# Patient Record
Sex: Female | Born: 1995 | Race: White | Hispanic: No | Marital: Single | Smoking: Never smoker
Health system: Southern US, Community
[De-identification: ages and names within clinical notes are randomized; demographics above are authoritative.]

## PROBLEM LIST (undated history)

## (undated) DIAGNOSIS — R569 Unspecified convulsions: Secondary | ICD-10-CM

---

## 2018-03-03 ENCOUNTER — Emergency Department (HOSPITAL_COMMUNITY)
Admission: EM | Admit: 2018-03-03 | Discharge: 2018-03-04 | Disposition: A | Discharge status: Home / Self Care | Attending: Emergency Medicine | Admitting: Emergency Medicine

## 2018-03-03 ENCOUNTER — Other Ambulatory Visit: Payer: Self-pay

## 2018-03-03 ENCOUNTER — Encounter (HOSPITAL_COMMUNITY): Payer: Self-pay | Admitting: *Deleted

## 2018-03-03 DIAGNOSIS — R1031 Right lower quadrant pain: Secondary | ICD-10-CM | POA: Insufficient documentation

## 2018-03-03 DIAGNOSIS — Z79899 Other long term (current) drug therapy: Secondary | ICD-10-CM | POA: Diagnosis not present

## 2018-03-03 HISTORY — DX: Unspecified convulsions: R56.9

## 2018-03-03 LAB — CBC
HCT: 41.2 % (ref 36.0–46.0)
Hemoglobin: 13.8 g/dL (ref 12.0–15.0)
MCH: 30.2 pg (ref 26.0–34.0)
MCHC: 33.5 g/dL (ref 30.0–36.0)
MCV: 90.2 fL (ref 80.0–100.0)
Platelets: 412 10*3/uL — ABNORMAL HIGH (ref 150–400)
RBC: 4.57 MIL/uL (ref 3.87–5.11)
RDW: 11.6 % (ref 11.5–15.5)
WBC: 11.6 10*3/uL — ABNORMAL HIGH (ref 4.0–10.5)
nRBC: 0 % (ref 0.0–0.2)

## 2018-03-03 LAB — URINALYSIS, ROUTINE W REFLEX MICROSCOPIC
Bacteria, UA: NONE SEEN
Bilirubin Urine: NEGATIVE
Glucose, UA: NEGATIVE mg/dL
Ketones, ur: NEGATIVE mg/dL
Leukocytes, UA: NEGATIVE
Nitrite: NEGATIVE
PROTEIN: NEGATIVE mg/dL
Specific Gravity, Urine: 1.021 (ref 1.005–1.030)
pH: 5 (ref 5.0–8.0)

## 2018-03-03 LAB — COMPREHENSIVE METABOLIC PANEL
ALT: 17 U/L (ref 0–44)
AST: 22 U/L (ref 15–41)
Albumin: 4.2 g/dL (ref 3.5–5.0)
Alkaline Phosphatase: 71 U/L (ref 38–126)
Anion gap: 12 (ref 5–15)
BUN: 11 mg/dL (ref 6–20)
CO2: 21 mmol/L — ABNORMAL LOW (ref 22–32)
Calcium: 9.4 mg/dL (ref 8.9–10.3)
Chloride: 107 mmol/L (ref 98–111)
Creatinine, Ser: 0.83 mg/dL (ref 0.44–1.00)
GFR calc Af Amer: >60 mL/min (ref >60)
GFR calc non Af Amer: >60 mL/min (ref >60)
GLUCOSE: 118 mg/dL — AB (ref 70–99)
Potassium: 3.9 mmol/L (ref 3.5–5.1)
Sodium: 140 mmol/L (ref 135–145)
Total Bilirubin: 0.4 mg/dL (ref 0.3–1.2)
Total Protein: 7.2 g/dL (ref 6.5–8.1)

## 2018-03-03 LAB — LIPASE, BLOOD: Lipase: 24 U/L (ref 11–51)

## 2018-03-03 LAB — I-STAT BETA HCG BLOOD, ED (MC, WL, AP ONLY)

## 2018-03-03 MED ORDER — SODIUM CHLORIDE 0.9% FLUSH: 3.0000 mL | Freq: Once | INTRAVENOUS | Status: DC

## 2018-03-03 NOTE — ED Triage Notes (Signed)
Right lower abdominal pain started about 1 hour ago, vomited x1, no fevers. Took ibuprofen about 1 hour ago. Offered nausea meds, refused at this time.

## 2018-03-04 NOTE — ED Provider Notes (Signed)
[facility] EMERGENCY DEPARTMENT Provider Note   CSN: [id number] Arrival date & time: 03/03/18  2048     History   Chief Complaint Chief Complaint  Patient presents with  . Abdominal Pain    HPI Sarah Erickson is a 23 y.o. female.  The history is provided by the patient.  Abdominal Pain  She has history of seizures, and comes in with right lower quadrant pain which started about 7:30 PM.  Pain came on suddenly, and was described as sharp.  She rated pain at 10/10.  Nothing made it better, nothing made it worse.  There is associated nausea and vomiting.  Pain lasted until about 1 AM when it spontaneously went away.  She is completely pain-free now.  There was some slight vaginal spotting.  Last menses was 2 weeks ago and normal.  She denies fever, chills, sweats.  She denies urinary urgency, frequency, tenesmus, dysuria.  Past Medical History:  Diagnosis Date  . Seizures (HCC)    epilepsy    There are no active problems to display for this patient.   History reviewed. No pertinent surgical history.   OB History   No obstetric history on file.      Home Medications    Prior to Admission medications   Medication Sig Start Date End Date Taking? Authorizing Provider  LamoTRIgine (LAMICTAL XR) 300 MG TB24 24 hour tablet Take 300 mg by mouth daily.   Yes [provider]  levETIRAcetam (KEPPRA XR) 500 MG 24 hr tablet Take 1,500 mg by mouth daily.   Yes [provider]    Family History No family history on file.  Social History Social History   Tobacco Use  . Smoking status: Never Smoker  Substance Use Topics  . Alcohol use: Yes  . Drug use: Never     Allergies   Patient has no known allergies.   Review of Systems Review of Systems  Gastrointestinal: Positive for abdominal pain.  All other systems reviewed and are negative.    Physical Exam Updated Vital Signs BP (!) 113/59   Pulse 77   Temp 98.1 F (36.7 C) (Oral)    Resp 18   Ht 5\' 5"  (1.651 m)   Wt 99.3 kg   LMP 02/17/2018   SpO2 97%   BMI 36.44 kg/m   Physical Exam Vitals signs and nursing note reviewed.    23 year old female, resting comfortably and in no acute distress. Vital signs are normal. Oxygen saturation is 97%, which is normal. Head is normocephalic and atraumatic. PERRLA, EOMI. Oropharynx is clear. Neck is nontender and supple without adenopathy or JVD. Back is nontender and there is no CVA tenderness. Lungs are clear without rales, wheezes, or rhonchi. Chest is nontender. Heart has regular rate and rhythm without murmur. Abdomen is soft, flat, nontender without masses or hepatosplenomegaly and peristalsis is normoactive. Extremities have no cyanosis or edema, full range of motion is present. Skin is warm and dry without rash. Neurologic: Mental status is normal, cranial nerves are intact, there are no motor or sensory deficits.  ED Treatments / Results  Labs (all labs ordered are listed, but only abnormal results are displayed) Labs Reviewed  COMPREHENSIVE METABOLIC PANEL - Abnormal; Notable for the following components:      Result Value   CO2 21 (*)    Glucose, Bld 118 (*)    All other components within normal limits  CBC - Abnormal; Notable for the following components:  WBC 11.6 (*)    Platelets 412 (*)    All other components within normal limits  URINALYSIS, ROUTINE W REFLEX MICROSCOPIC - Abnormal; Notable for the following components:   Hgb urine dipstick SMALL (*)    All other components within normal limits  LIPASE, BLOOD  I-STAT BETA HCG BLOOD, ED ([facility], [facility], [facility] ONLY)   Procedures Procedures   Medications Ordered in ED Medications  sodium chloride flush (NS) 0.9 % injection 3 mL (has no administration in time range)     Initial Impression / Assessment and Plan / ED Course  I have reviewed the triage vital signs and the nursing notes.  Pertinent lab results that were available during my care of the  patient were reviewed by me and considered in my medical decision making (see chart for details).  Right lower quadrant pain which has resolved.  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  Labs are unremarkable.  With pain having resolved and completely benign exam, no indication for imaging.  I have discussed this with the patient and with her mother.  She is given strict return precautions.  Advised to use over-the-counter analgesics as needed for mild pain.  She has no prior records in the [facility] health system,  Final Clinical Impressions(s) / ED Diagnoses   Final diagnoses:  RLQ abdominal pain    ED Discharge Orders    None       Dione Booze, MD 03/04/18 [phone]

## 2018-03-04 NOTE — Discharge Instructions (Addendum)
You may take ibuprofen or acetaminophen as needed for pain.  Return if pain is getting worse.

## 2018-03-06 ENCOUNTER — Emergency Department (HOSPITAL_COMMUNITY)

## 2018-03-06 ENCOUNTER — Emergency Department (HOSPITAL_COMMUNITY)
Admission: EM | Admit: 2018-03-06 | Discharge: 2018-03-06 | Disposition: A | Discharge status: Home / Self Care | Attending: Emergency Medicine | Admitting: Emergency Medicine

## 2018-03-06 ENCOUNTER — Encounter (HOSPITAL_COMMUNITY): Payer: Self-pay

## 2018-03-06 DIAGNOSIS — R300 Dysuria: Secondary | ICD-10-CM | POA: Diagnosis present

## 2018-03-06 DIAGNOSIS — N3091 Cystitis, unspecified with hematuria: Secondary | ICD-10-CM | POA: Insufficient documentation

## 2018-03-06 DIAGNOSIS — Z79899 Other long term (current) drug therapy: Secondary | ICD-10-CM | POA: Diagnosis not present

## 2018-03-06 DIAGNOSIS — R52 Pain, unspecified: Secondary | ICD-10-CM

## 2018-03-06 DIAGNOSIS — N3090 Cystitis, unspecified without hematuria: Secondary | ICD-10-CM

## 2018-03-06 DIAGNOSIS — R3129 Other microscopic hematuria: Secondary | ICD-10-CM

## 2018-03-06 LAB — URINALYSIS, ROUTINE W REFLEX MICROSCOPIC
Bilirubin Urine: NEGATIVE
Glucose, UA: NEGATIVE mg/dL
Ketones, ur: NEGATIVE mg/dL
Leukocytes, UA: NEGATIVE
Nitrite: NEGATIVE
Protein, ur: NEGATIVE mg/dL
RBC / HPF: >50 RBC/hpf — ABNORMAL HIGH (ref 0–5)
SPECIFIC GRAVITY, URINE: 1.02 (ref 1.005–1.030)
pH: 7 (ref 5.0–8.0)

## 2018-03-06 LAB — PREGNANCY, URINE: Preg Test, Ur: NEGATIVE

## 2018-03-06 MED ORDER — NITROFURANTOIN MONOHYD MACRO 100 MG PO CAPS: 100.0000 mg | ORAL_CAPSULE | Freq: Two times a day (BID) | ORAL | 0 refills | Status: DC

## 2018-03-06 MED ORDER — PHENAZOPYRIDINE HCL 200 MG PO TABS: 200.0000 mg | ORAL_TABLET | Freq: Three times a day (TID) | ORAL | 0 refills | Status: AC

## 2018-03-06 MED ORDER — NITROFURANTOIN MONOHYD MACRO 100 MG PO CAPS: 100.0000 mg | ORAL_CAPSULE | Freq: Two times a day (BID) | ORAL | 0 refills | Status: AC

## 2018-03-06 MED ORDER — DOCUSATE SODIUM 250 MG PO CAPS: 250.0000 mg | ORAL_CAPSULE | Freq: Every day | ORAL | 0 refills | Status: DC

## 2018-03-06 NOTE — ED Triage Notes (Signed)
Pt presents for evaluation of dysuria. Reports she was here on 1/16 for abd pain and told she likely had ovarian cyst rupture. Urinalysis at that time was normal. Reports frequency and urgency as well.

## 2018-03-06 NOTE — ED Provider Notes (Addendum)
[facility] Provider Note   CSN: [id number] Arrival date & time: 03/06/18  1210     History   Chief Complaint Chief Complaint  Patient presents with  . Dysuria    HPI Ardyth Galmma Konitzer is a 23 y.o. female.  HPI 23 year old female with history of epilepsy comes in with chief complaint of pain with urination. Patient reports that she started having pain with urination about 2 days ago.  She was seen in the ER 3 days ago with sudden onset right-sided abdominal pain.  Her pain was in the right lower quadrant and then resolved by the time she was seen in the ER.  The day after she started noticing some pain with urination.  She is also noticing urinary frequency without any hematuria.  Patient has no vaginal bleeding, vaginal discharge, her last period was 3 weeks ago.  Patient has no history of STDs and denies any risk factors for the same.  Past Medical History:  Diagnosis Date  . Seizures (HCC)    epilepsy    There are no active problems to display for this patient.   History reviewed. No pertinent surgical history.   OB History   No obstetric history on file.      Home Medications    Prior to Admission medications   Medication Sig Start Date End Date Taking? Authorizing Provider  LamoTRIgine (LAMICTAL XR) 300 MG TB24 24 hour tablet Take 300 mg by mouth daily.    [provider]  levETIRAcetam (KEPPRA XR) 500 MG 24 hr tablet Take 1,500 mg by mouth daily.    [provider]  nitrofurantoin, macrocrystal-monohydrate, (MACROBID) 100 MG capsule Take 1 capsule (100 mg total) by mouth 2 (two) times daily. 03/06/18   Derwood KaplanNanavati, Catia Todorov, MD  phenazopyridine (PYRIDIUM) 200 MG tablet Take 1 tablet (200 mg total) by mouth 3 (three) times daily. 03/06/18   Derwood KaplanNanavati, Seleny Allbright, MD    Family History No family history on file.  Social History Social History   Tobacco Use  . Smoking status: Never Smoker  Substance Use Topics  .  Alcohol use: Yes  . Drug use: Never     Allergies   Patient has no known allergies.   Review of Systems Review of Systems  Constitutional: Positive for activity change.  Gastrointestinal: Negative for nausea and vomiting.  Genitourinary: Positive for dysuria. Negative for vaginal bleeding and vaginal discharge.     Physical Exam Updated Vital Signs BP 133/88 (BP Location: Right Arm)   Pulse 87   Temp 98.1 F (36.7 C)   Resp 16   LMP 02/17/2018   SpO2 96%   Physical Exam Vitals signs and nursing note reviewed.  Constitutional:      Appearance: She is well-developed.  HENT:     Head: Normocephalic and atraumatic.  Neck:     Musculoskeletal: Normal range of motion and neck supple.  Cardiovascular:     Rate and Rhythm: Normal rate.  Pulmonary:     Effort: Pulmonary effort is normal.  Abdominal:     General: Bowel sounds are normal.     Tenderness: There is no abdominal tenderness.  Skin:    General: Skin is warm and dry.  Neurological:     Mental Status: She is alert and oriented to person, place, and time.      ED Treatments / Results  Labs (all labs ordered are listed, but only abnormal results are displayed) Labs Reviewed  URINALYSIS, ROUTINE W REFLEX MICROSCOPIC -  Abnormal; Notable for the following components:      Result Value   APPearance CLOUDY (*)    Hgb urine dipstick MODERATE (*)    RBC / HPF >50 (*)    Bacteria, UA RARE (*)    All other components within normal limits  PREGNANCY, URINE    EKG None  Radiology [place] Pelvis (transabdominal Only)  Result Date: 03/06/2018 CLINICAL DATA:  Abdomen pain. EXAM: TRANSABDOMINAL ULTRASOUND OF PELVIS TECHNIQUE: Transabdominal ultrasound examination of the pelvis was performed including evaluation of the uterus, ovaries, adnexal regions, and pelvic cul-de-sac. COMPARISON:  None. FINDINGS: Uterus Measurements: 7.3 x 2.7 x 3.9 cm No fibroids or other mass visualized. Endometrium Thickness: 10 mm.  No focal  abnormality visualized. Right ovary Measurements: 2.6 x 1.4 x 2.1 cm = volume: 4.38 mL. Normal appearance/no adnexal mass. Left ovary Measurements: 2.1 x 1.6 x 1.2 cm = volume: 2.34 mL. Normal appearance/no adnexal mass. Other findings:  Trace free fluid is identified. IMPRESSION: No acute abnormality. Electronically Signed   By: Sherian ReinWei-Chen  Lin M.D.   On: 03/06/2018 16:36   [place] Renal  Result Date: 03/06/2018 CLINICAL DATA:  Flank pain EXAM: RENAL / URINARY TRACT ULTRASOUND COMPLETE COMPARISON:  None. FINDINGS: Right Kidney: Renal measurements: 10.2 x 3.8 x 4.8 cm = volume: 99.9 mL . Echogenicity within normal limits. No mass or hydronephrosis visualized. Left Kidney: Renal measurements: 11 x 4.3 x 4.1 cm = volume: 104.4 mL. Echogenicity within normal limits. No mass or hydronephrosis visualized. Bladder: Appears normal for degree of bladder distention. IMPRESSION: Normal renal ultrasound. Electronically Signed   By: Sherian ReinWei-Chen  Lin M.D.   On: 03/06/2018 16:15    Procedures Procedures (including critical care time)  Medications Ordered in ED Medications - No data to display   Initial Impression / Assessment and Plan / ED Course  I have reviewed the triage vital signs and the nursing notes.  Pertinent labs & imaging results that were available during my care of the patient were reviewed by me and considered in my medical decision making (see chart for details).     Patient comes in with chief complaint of dysuria.  She also reports intermittent episodes of right-sided abdominal pain.  Along with her dysuria she is having urinary frequency but no gross hematuria.  She has no STD risk factors and denies any vaginal discharge or bleeding.  At the moment she has no pelvic pain.  Differential diagnosis includes UTI.  We ordered a UA which is positive for RBCs, but does not have any other significant markers of infection.  The next office to get ultrasound pelvis and ultrasound of the kidneys to see if  there is any possibility patient might have a kidney stone or fibroids/cyst.  If the ultrasound results are normal then we will send her home with Macrobid.  4:58 PM Results from the ER workup discussed with the patient face to face and all questions answered to the best of my ability. We will treat the uti with Macrobid.  Final Clinical Impressions(s) / ED Diagnoses   Final diagnoses:  Pain  Cystitis  Microscopic hematuria    ED Discharge Orders         Ordered    nitrofurantoin, macrocrystal-monohydrate, (MACROBID) 100 MG capsule  2 times daily,   Status:  Discontinued     03/06/18 1645    docusate sodium (COLACE) 250 MG capsule  Daily,   Status:  Discontinued     03/06/18 1645    phenazopyridine (PYRIDIUM)  200 MG tablet  3 times daily     03/06/18 1658    nitrofurantoin, macrocrystal-monohydrate, (MACROBID) 100 MG capsule  2 times daily     03/06/18 1658           Derwood Kaplan, MD 03/06/18 1546    Derwood Kaplan, MD 03/06/18 1658

## 2018-03-06 NOTE — ED Notes (Signed)
ED Provider at bedside. 

## 2018-03-06 NOTE — ED Notes (Signed)
Patient verbalizes understanding of discharge instructions. Opportunity for questioning and answers were provided. Armband removed by staff, pt discharged from ED. Pt ambulatory to lobby.  

## 2018-03-06 NOTE — Discharge Instructions (Signed)
The ultrasound does not reveal any concerning findings. Suspect that you might be having a cystitis, therefore we will send you home with antibiotics.  If your symptoms do not improve, then you need to consider seeing a primary care doctor or a gynecologist.

## 2020-07-20 IMAGING — US US PELVIS COMPLETE
1 series · 14 of 25 positions shown · non-contrast
Comparison: None.

CLINICAL DATA: Abdomen pain.

EXAM:
TRANSABDOMINAL ULTRASOUND OF PELVIS
TECHNIQUE: Transabdominal ultrasound examination of the pelvis was performed
including evaluation of the uterus, ovaries, adnexal regions, and
pelvic cul-de-sac.

[Series 1: us pelvis complete · 14 of 27 slices shown]
[im 1/27]
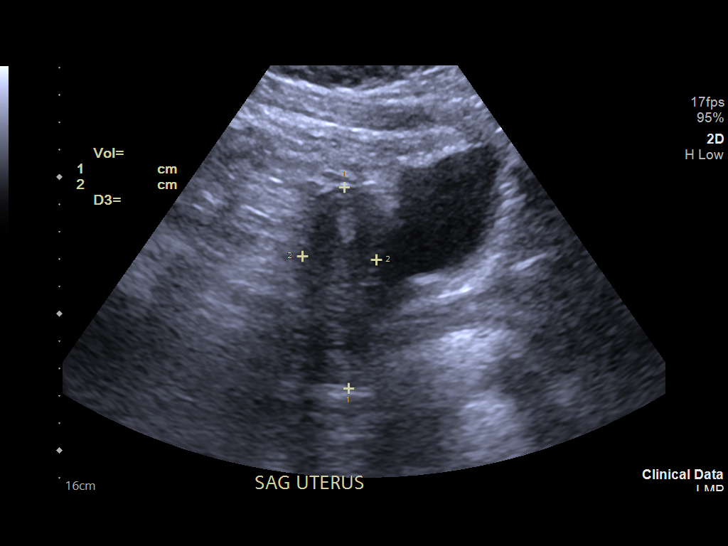
[im 3/27]
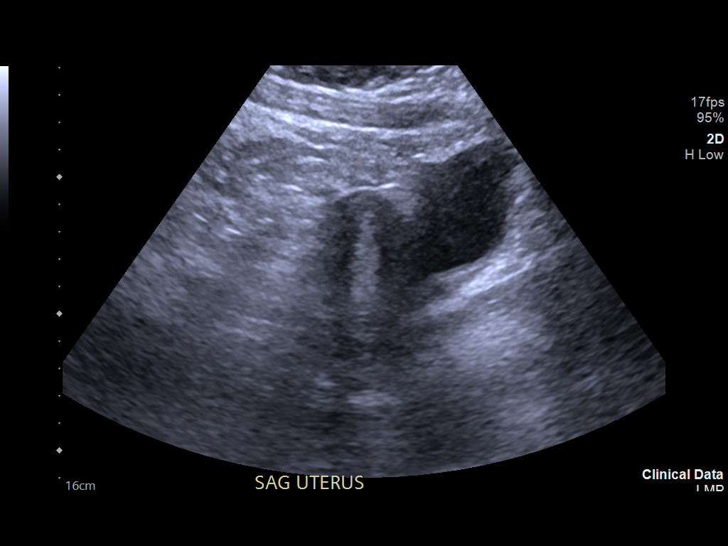
[im 5/27]
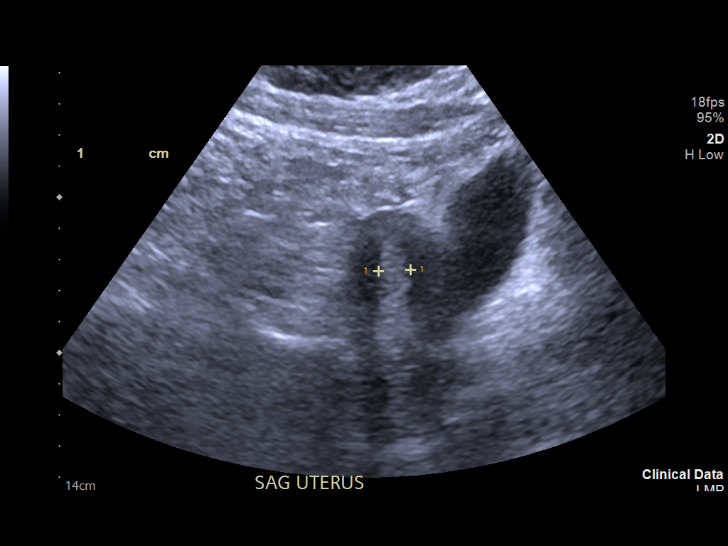
[im 7/27]
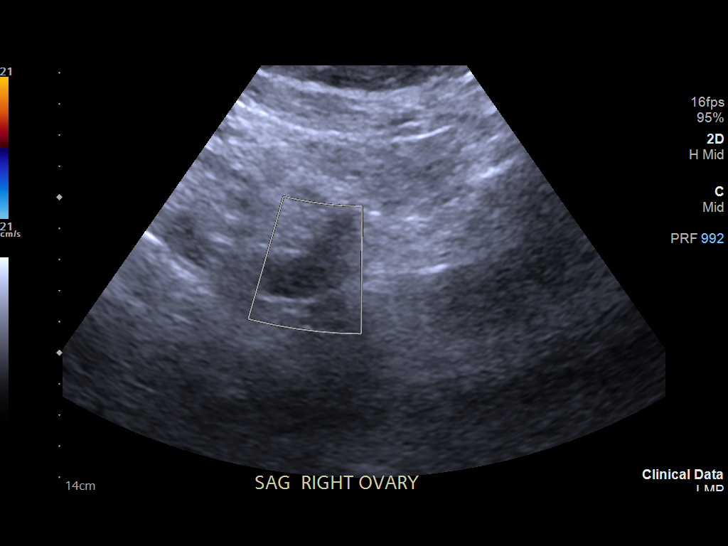
[im 9/27]
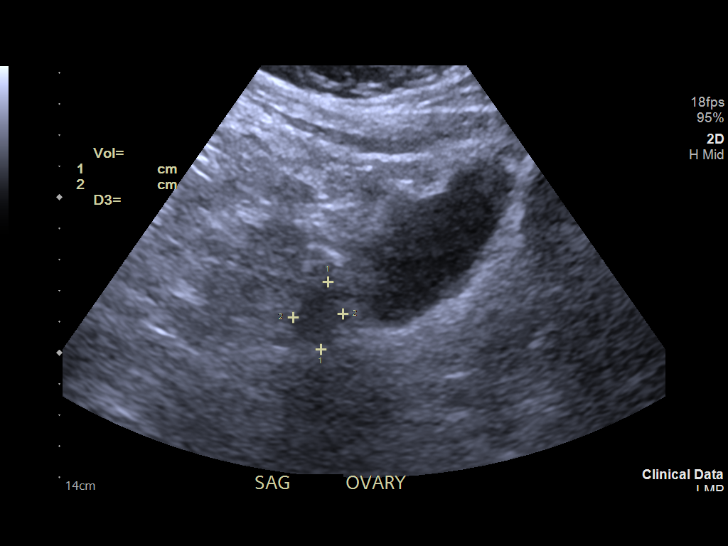
[im 10/27]
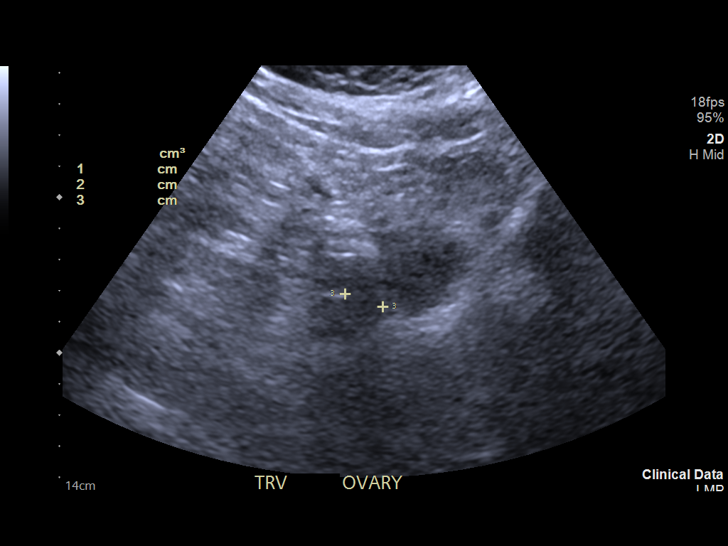
[im 12/27]
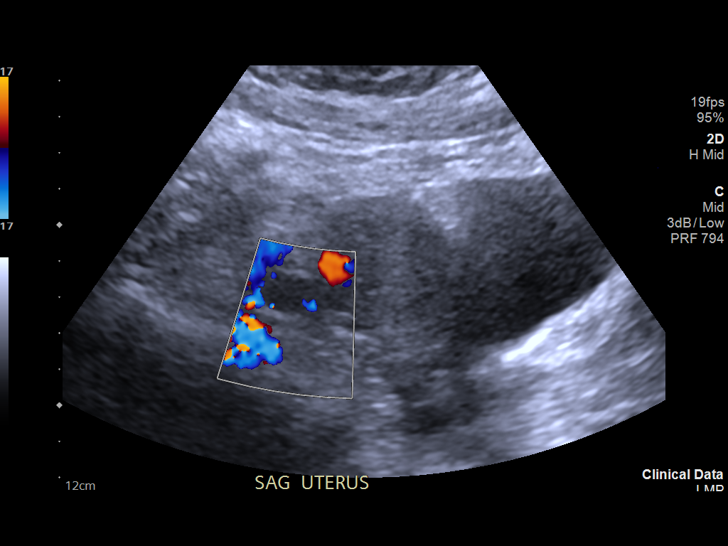
[im 15/27]
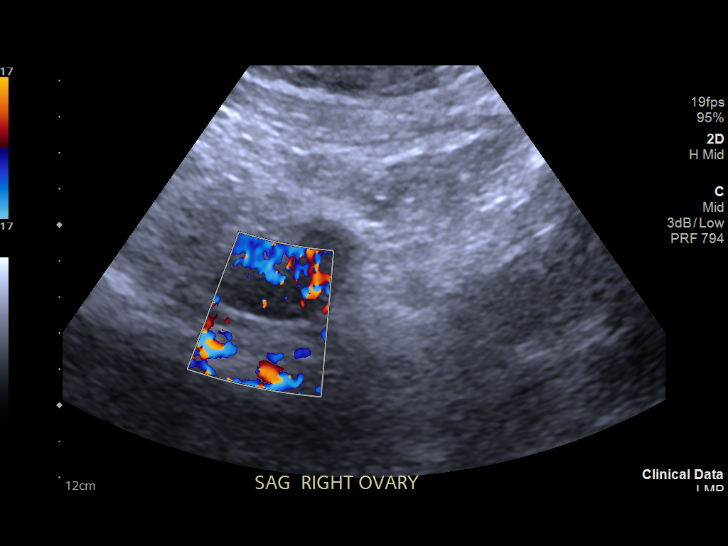
[im 17/27]
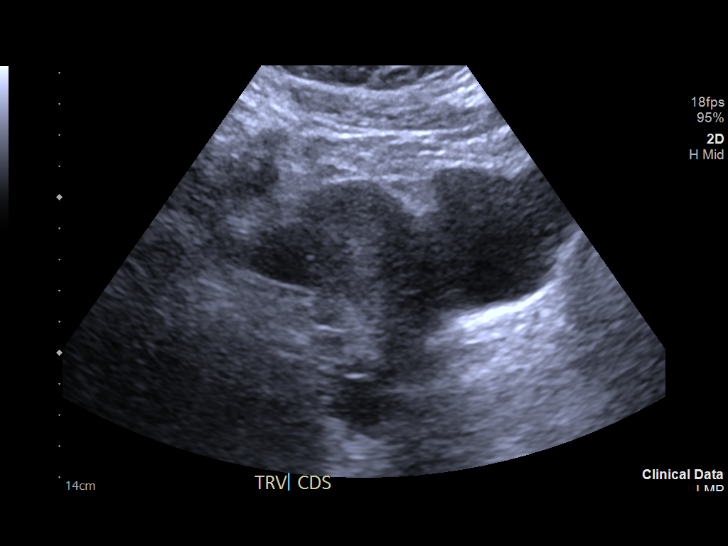
[im 18/27]
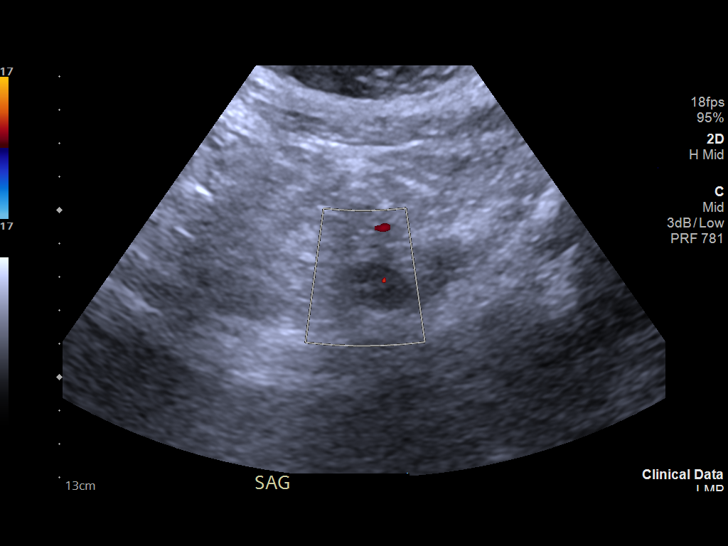
[im 20/27]
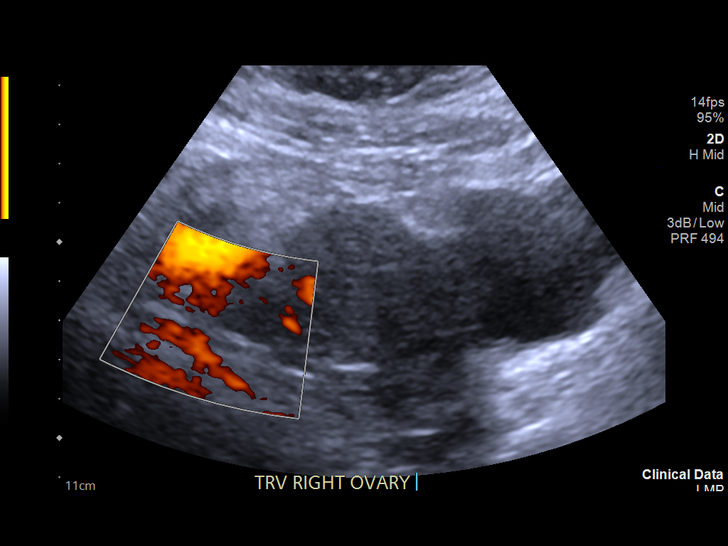
[im 22/27]
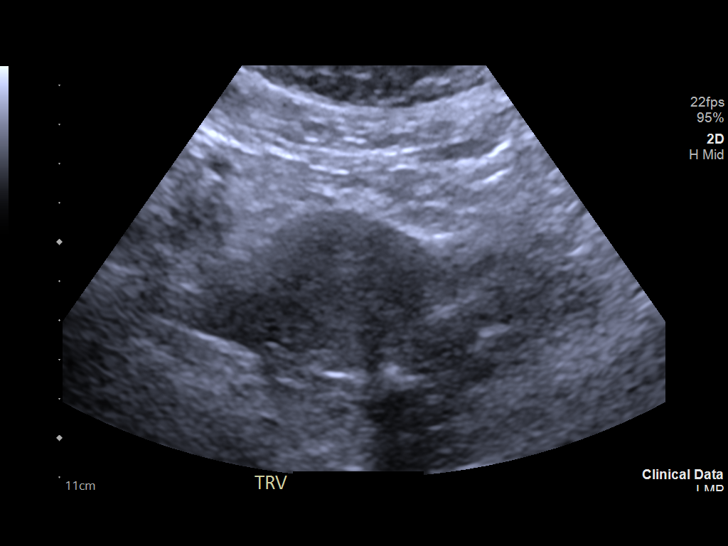
[im 24/27]
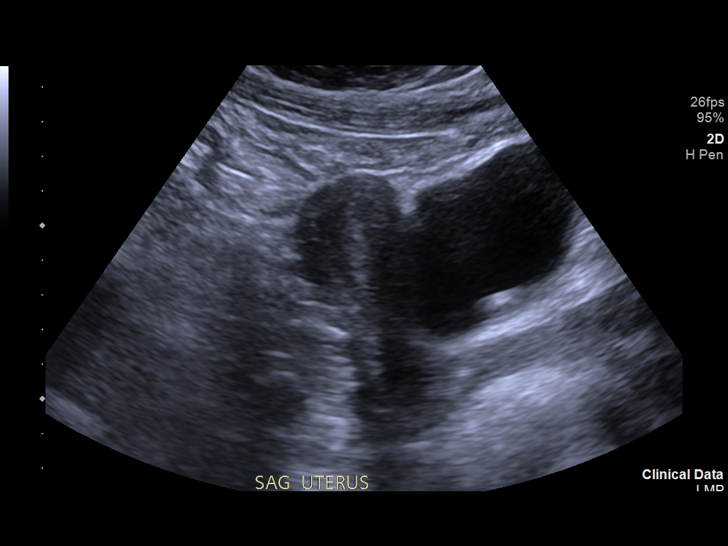
[im 27/27]
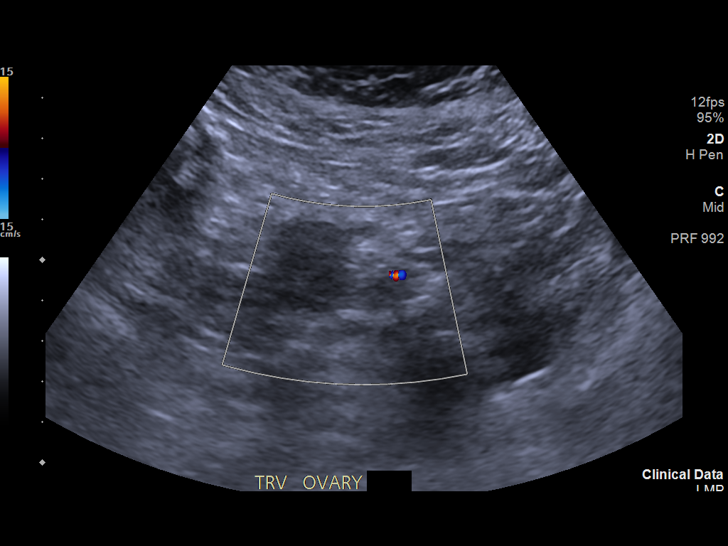

[14 of 25 positions shown; findings below may reference images not displayed]

FINDINGS: Uterus

Measurements: 7.3 x 2.7 x 3.9 cm No fibroids or other mass
visualized.

Endometrium

Thickness: 10 mm.  No focal abnormality visualized.

Right ovary

Measurements: 2.6 x 1.4 x 2.1 cm = volume: 4.38 mL. Normal
appearance/no adnexal mass.

Left ovary

Measurements: 2.1 x 1.6 x 1.2 cm = volume: 2.34 mL. Normal
appearance/no adnexal mass.

Other findings:  Trace free fluid is identified.
IMPRESSION: No acute abnormality.

## 2020-07-20 IMAGING — US US RENAL
1 series · 14 of 25 positions shown · non-contrast
Comparison: None.

CLINICAL DATA: Flank pain

EXAM:
RENAL / URINARY TRACT ULTRASOUND COMPLETE

[Series 1: us renal · 14 of 31 slices shown]
[im 1/31]
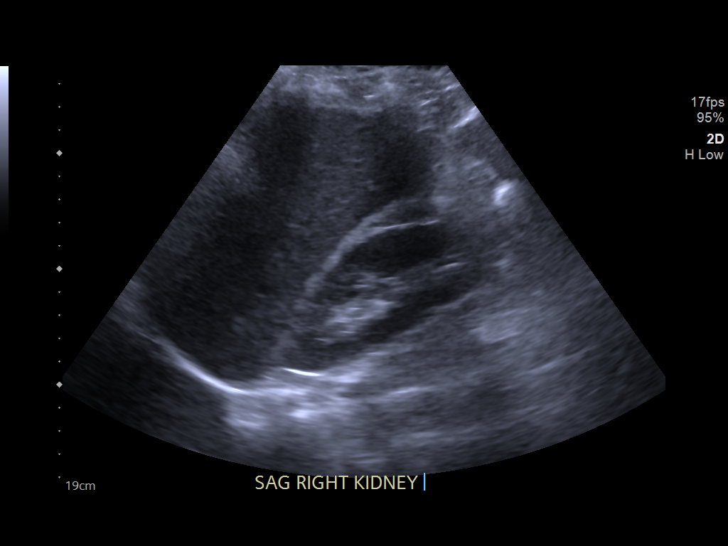
[im 3/31]
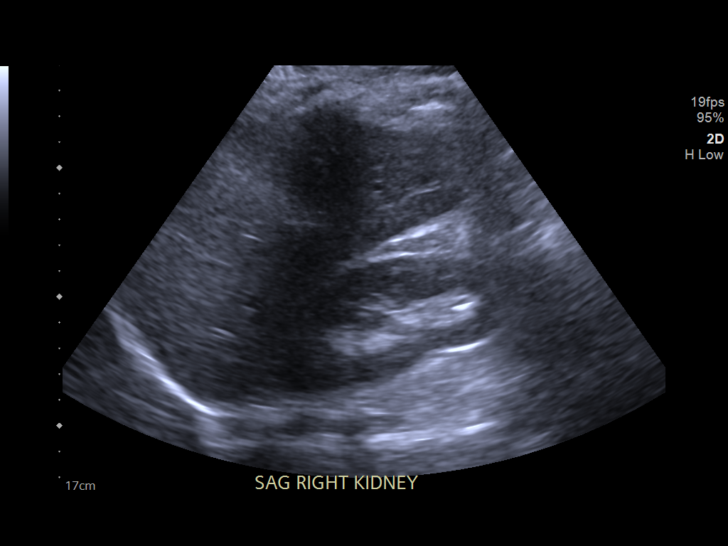
[im 6/31]
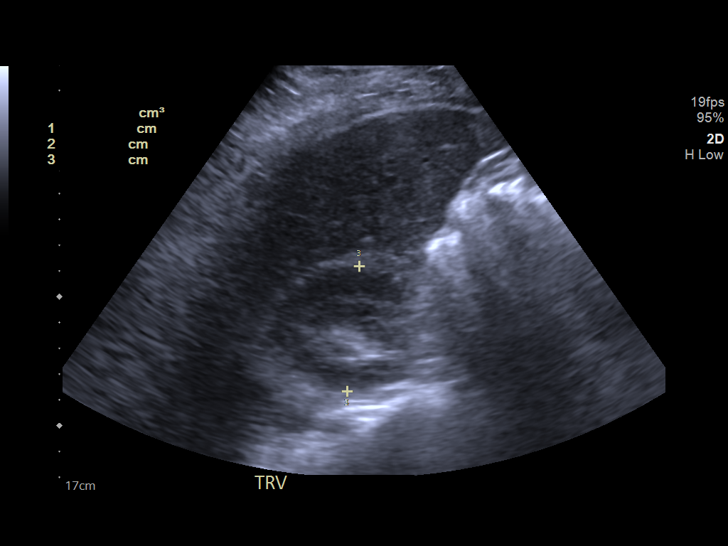
[im 8/31]
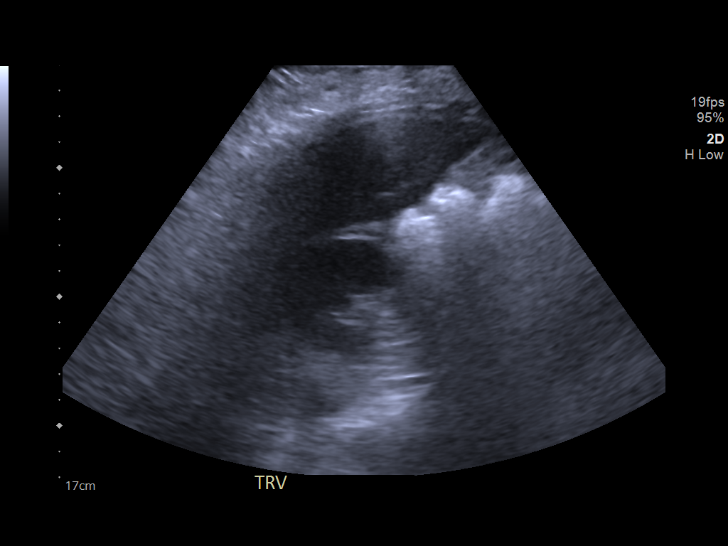
[im 11/31]
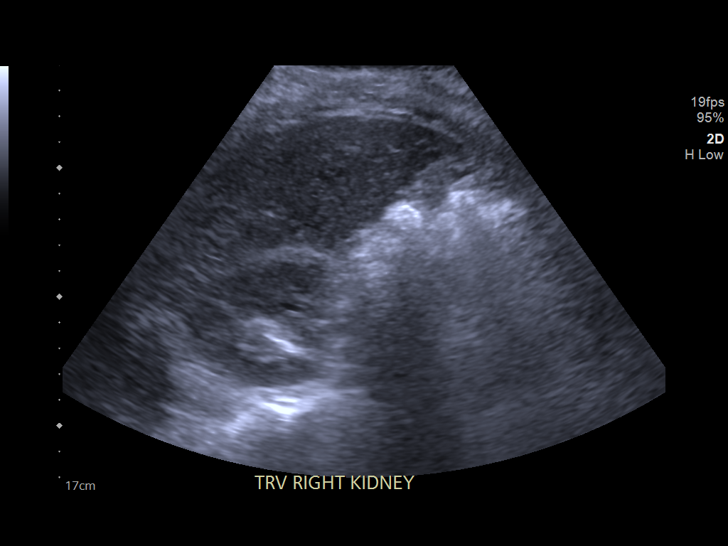
[im 12/31]
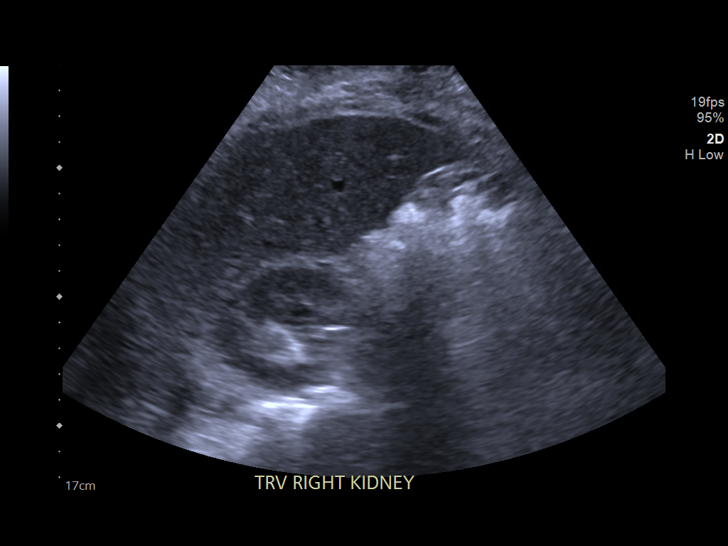
[im 14/31]
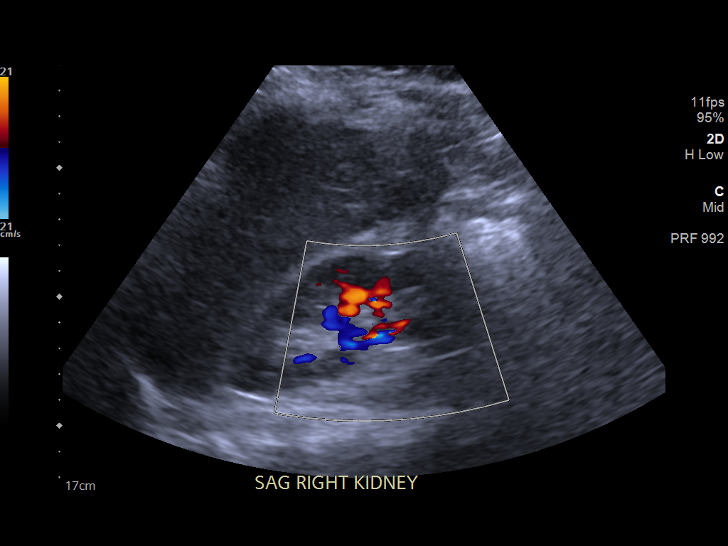
[im 17/31]
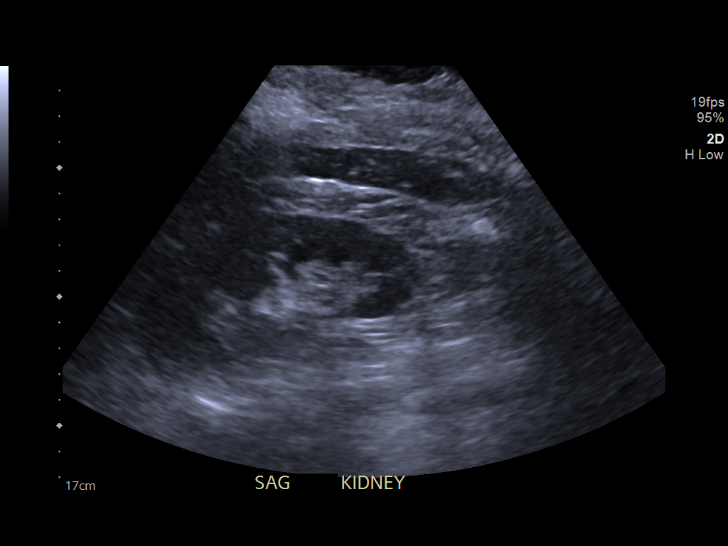
[im 19/31]
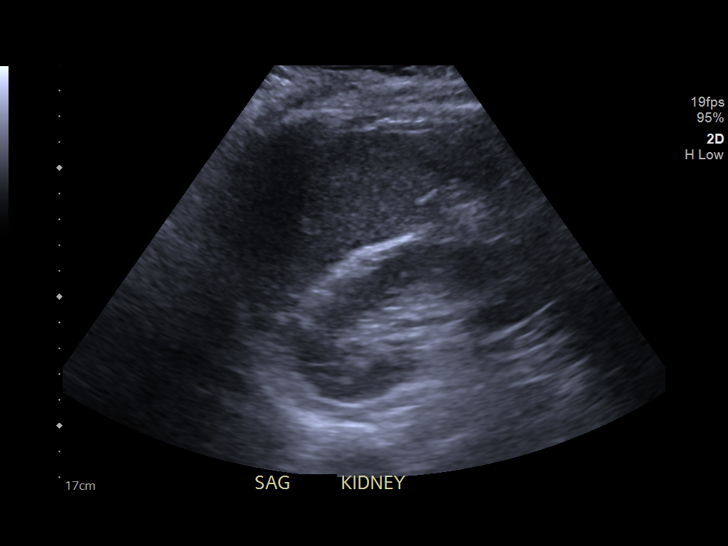
[im 21/31]
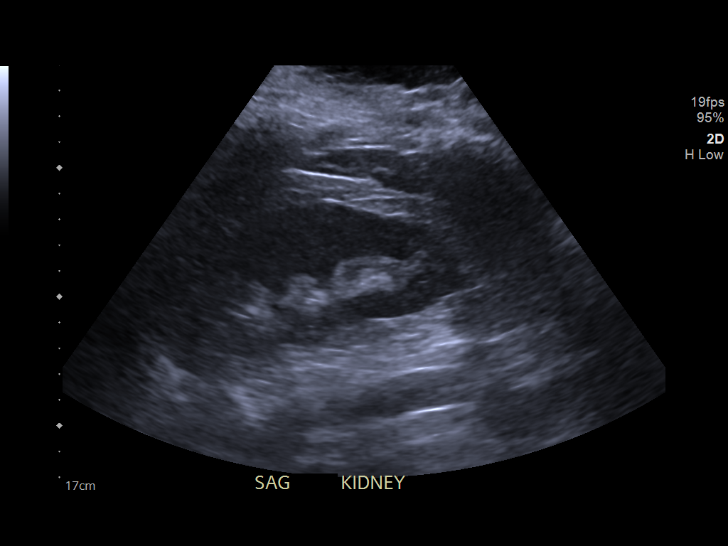
[im 23/31]
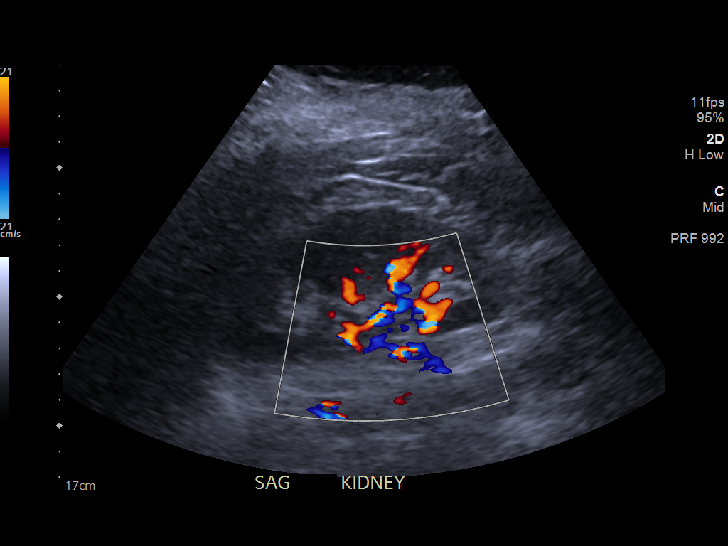
[im 26/31]
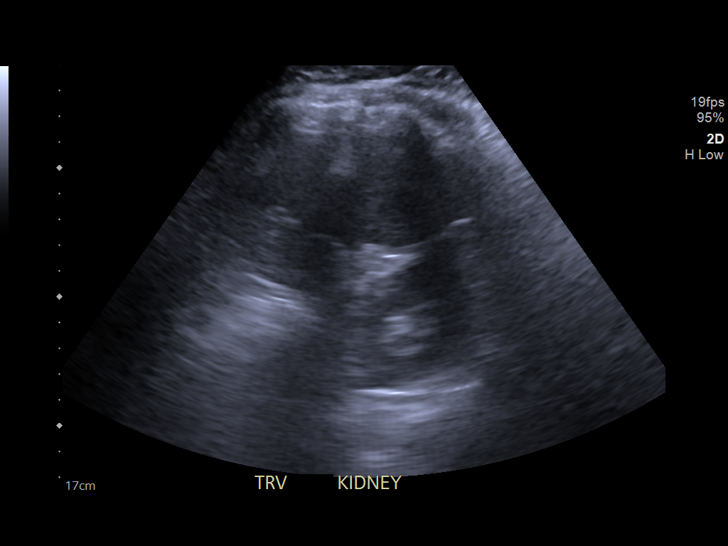
[im 28/31]
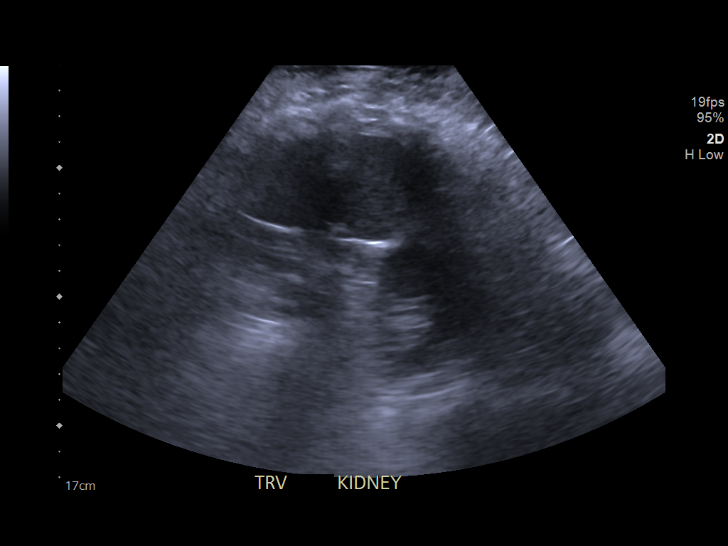
[im 31/31]
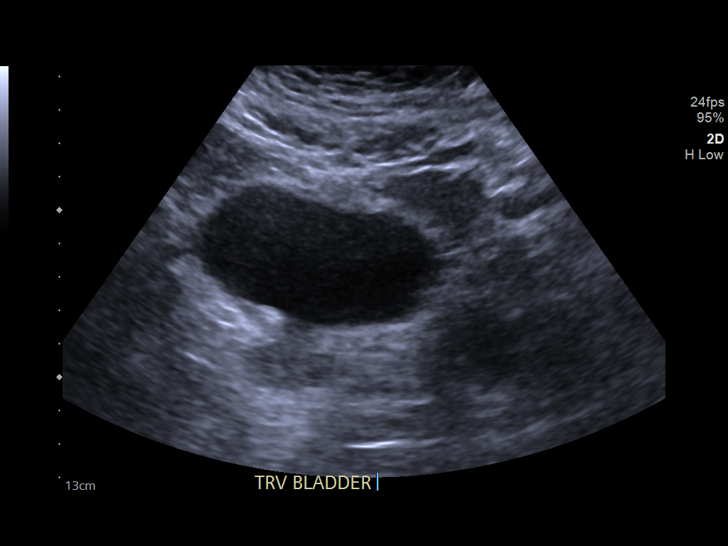

[14 of 25 positions shown; findings below may reference images not displayed]

FINDINGS: Right Kidney:

Renal measurements: 10.2 x 3.8 x 4.8 cm = volume: 99.9 mL .
Echogenicity within normal limits. No mass or hydronephrosis
visualized.

Left Kidney:

Renal measurements: 11 x 4.3 x 4.1 cm = volume: 104.4 mL.
Echogenicity within normal limits. No mass or hydronephrosis
visualized.

Bladder:

Appears normal for degree of bladder distention.
IMPRESSION: Normal renal ultrasound.
